# Patient Record
Sex: Male | Born: 1990 | Race: Black or African American | Hispanic: No | Marital: Single | Smoking: Current every day smoker
Health system: Southern US, Community
[De-identification: ages and names within clinical notes are randomized; demographics above are authoritative.]

## PROBLEM LIST (undated history)

## (undated) DIAGNOSIS — N4400 Torsion of testis, unspecified: Secondary | ICD-10-CM

---

## 2015-07-31 ENCOUNTER — Emergency Department: Payer: No Typology Code available for payment source

## 2015-07-31 ENCOUNTER — Emergency Department
Admission: EM | Admit: 2015-07-31 | Discharge: 2015-07-31 | Disposition: A | Discharge status: Home / Self Care | Payer: No Typology Code available for payment source | Attending: Emergency Medicine | Admitting: Emergency Medicine

## 2015-07-31 ENCOUNTER — Encounter: Payer: Self-pay | Admitting: Emergency Medicine

## 2015-07-31 DIAGNOSIS — Y998 Other external cause status: Secondary | ICD-10-CM | POA: Diagnosis not present

## 2015-07-31 DIAGNOSIS — F17210 Nicotine dependence, cigarettes, uncomplicated: Secondary | ICD-10-CM | POA: Insufficient documentation

## 2015-07-31 DIAGNOSIS — S20221A Contusion of right back wall of thorax, initial encounter: Secondary | ICD-10-CM | POA: Diagnosis not present

## 2015-07-31 DIAGNOSIS — Y9389 Activity, other specified: Secondary | ICD-10-CM | POA: Insufficient documentation

## 2015-07-31 DIAGNOSIS — S8012XA Contusion of left lower leg, initial encounter: Secondary | ICD-10-CM | POA: Insufficient documentation

## 2015-07-31 DIAGNOSIS — S9031XA Contusion of right foot, initial encounter: Secondary | ICD-10-CM | POA: Insufficient documentation

## 2015-07-31 DIAGNOSIS — Y92410 Unspecified street and highway as the place of occurrence of the external cause: Secondary | ICD-10-CM | POA: Diagnosis not present

## 2015-07-31 DIAGNOSIS — S8991XA Unspecified injury of right lower leg, initial encounter: Secondary | ICD-10-CM | POA: Diagnosis present

## 2015-07-31 DIAGNOSIS — S3991XA Unspecified injury of abdomen, initial encounter: Secondary | ICD-10-CM | POA: Diagnosis not present

## 2015-07-31 MED ORDER — SODIUM CHLORIDE 0.9 % IV BOLUS (SEPSIS)
1000.0000 mL | Freq: Once | INTRAVENOUS | Status: AC
  Administered 2015-07-31: 1000 mL via INTRAVENOUS

## 2015-07-31 MED ORDER — IOHEXOL 300 MG/ML  SOLN
100.0000 mL | Freq: Once | INTRAMUSCULAR | Status: AC | PRN
  Administered 2015-07-31: 100 mL via INTRAVENOUS

## 2015-07-31 MED ORDER — IBUPROFEN 800 MG PO TABS: 800.0000 mg | ORAL_TABLET | Freq: Three times a day (TID) | ORAL | Status: AC | PRN

## 2015-07-31 MED ORDER — ONDANSETRON HCL 4 MG/2ML IJ SOLN
4.0000 mg | Freq: Once | INTRAMUSCULAR | Status: AC
  Administered 2015-07-31: 4 mg via INTRAVENOUS
  Filled 2015-07-31: qty 2

## 2015-07-31 MED ORDER — MORPHINE SULFATE (PF) 4 MG/ML IV SOLN
4.0000 mg | Freq: Once | INTRAVENOUS | Status: AC
  Administered 2015-07-31: 4 mg via INTRAVENOUS
  Filled 2015-07-31: qty 1

## 2015-07-31 NOTE — ED Notes (Signed)
MD at bedside assessing patient  
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
## 2015-07-31 NOTE — Discharge Instructions (Signed)
Examination emergency department is reassuring for no broken bones or internal injuries. You are being treated for bruising/contusion as well as muscular stretching/strain.  Use ice to any particular sore areas for 24 hours, for 15 minutes at a time. After 24 hours, you may use heat for easing soreness.  Return to the emergency department for any worsening condition including worsening pain, any weakness or numbness, or any other symptoms concerning to you.   Contusion A contusion is a deep bruise. Contusions happen when an injury causes bleeding under the skin. Symptoms of bruising include pain, swelling, and discolored skin. The skin may turn blue, purple, or yellow. HOME CARE   Rest the injured area.  If told, put ice on the injured area.  Put ice in a plastic bag.  Place a towel between your skin and the bag.  Leave the ice on for 20 minutes, 2-3 times per day.  If told, put light pressure (compression) on the injured area using an elastic bandage. Make sure the bandage is not too tight. Remove it and put it back on as told by your doctor.  If possible, raise (elevate) the injured area above the level of your heart while you are sitting or lying down.  Take over-the-counter and prescription medicines only as told by your doctor. GET HELP IF:  Your symptoms do not get better after several days of treatment.  Your symptoms get worse.  You have trouble moving the injured area. GET HELP RIGHT AWAY IF:   You have very bad pain.  You have a loss of feeling (numbness) in a hand or foot.  Your hand or foot turns pale or cold.   This information is not intended to replace advice given to you by your health care provider. Make sure you discuss any questions you have with your health care provider.   Document Released: 01/03/2008 Document Revised: 04/07/2015 Document Reviewed: 12/02/2014 Elsevier Interactive Patient Education 2016 [employer] Muscle Strain A muscle strain is  an injury that occurs when a muscle is stretched beyond its normal length. Usually a small number of muscle fibers are torn when this happens. Muscle strain is rated in degrees. First-degree strains have the least amount of muscle fiber tearing and pain. Second-degree and third-degree strains have increasingly more tearing and pain.  Usually, recovery from muscle strain takes 1-2 weeks. Complete healing takes 5-6 weeks.  CAUSES  Muscle strain happens when a sudden, violent force placed on a muscle stretches it too far. This may occur with lifting, sports, or a fall.  RISK FACTORS Muscle strain is especially common in athletes.  SIGNS AND SYMPTOMS At the site of the muscle strain, there may be:  Pain.  Bruising.  Swelling.  Difficulty using the muscle due to pain or lack of normal function. DIAGNOSIS  Your health care provider will perform a physical exam and ask about your medical history. TREATMENT  Often, the best treatment for a muscle strain is resting, icing, and applying cold compresses to the injured area.  HOME CARE INSTRUCTIONS   Use the PRICE method of treatment to promote muscle healing during the first 2-3 days after your injury. The PRICE method involves:  Protecting the muscle from being injured again.  Restricting your activity and resting the injured body part.  Icing your injury. To do this, put ice in a plastic bag. Place a towel between your skin and the bag. Then, apply the ice and leave it on from 15-20 minutes each hour. After  the third day, switch to moist heat packs.  Apply compression to the injured area with a splint or elastic bandage. Be careful not to wrap it too tightly. This may interfere with blood circulation or increase swelling.  Elevate the injured body part above the level of your heart as often as you can.  Only take over-the-counter or prescription medicines for pain, discomfort, or fever as directed by your health care provider.  Warming up  prior to exercise helps to prevent future muscle strains. SEEK MEDICAL CARE IF:   You have increasing pain or swelling in the injured area.  You have numbness, tingling, or a significant loss of strength in the injured area. MAKE SURE YOU:   Understand these instructions.  Will watch your condition.  Will get help right away if you are not doing well or get worse.   This information is not intended to replace advice given to you by your health care provider. Make sure you discuss any questions you have with your health care provider.   Document Released: 07/17/2005 Document Revised: 05/07/2013 Document Reviewed: 02/13/2013 Elsevier Interactive Patient Education [employer]

## 2015-07-31 NOTE — ED Notes (Signed)
Patient transported to CT 
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
## 2015-07-31 NOTE — ED Notes (Signed)
X RAY at bedside 

## 2015-07-31 NOTE — ED Notes (Signed)
Patient instructed on use of crutches. Demonstrated proper use.

## 2015-07-31 NOTE — ED Notes (Signed)
Abdomen tender upon MDs palpation.
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
## 2015-07-31 NOTE — ED Notes (Signed)
MD at bedside. 
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
## 2016-02-13 ENCOUNTER — Encounter: Payer: Self-pay | Admitting: Emergency Medicine

## 2016-02-13 DIAGNOSIS — N390 Urinary tract infection, site not specified: Secondary | ICD-10-CM | POA: Insufficient documentation

## 2016-02-13 DIAGNOSIS — F17210 Nicotine dependence, cigarettes, uncomplicated: Secondary | ICD-10-CM | POA: Insufficient documentation

## 2016-02-13 DIAGNOSIS — N451 Epididymitis: Secondary | ICD-10-CM | POA: Insufficient documentation

## 2016-02-13 DIAGNOSIS — F1290 Cannabis use, unspecified, uncomplicated: Secondary | ICD-10-CM | POA: Insufficient documentation

## 2016-02-13 MED ORDER — OXYCODONE-ACETAMINOPHEN 5-325 MG PO TABS
1.0000 | ORAL_TABLET | Freq: Once | ORAL | Status: AC
  Administered 2016-02-13: 1 via ORAL

## 2016-02-13 NOTE — ED Notes (Signed)
Pt states right sided testicle swelling with pain. 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 Pt states has had small amount of blood from penis. Pt states is unable to walk due to pain or close legs.

## 2016-02-14 ENCOUNTER — Emergency Department: Payer: No Typology Code available for payment source

## 2016-02-14 ENCOUNTER — Emergency Department
Admission: EM | Admit: 2016-02-14 | Discharge: 2016-02-14 | Disposition: A | Discharge status: Home / Self Care | Payer: Self-pay | Attending: Emergency Medicine | Admitting: Emergency Medicine

## 2016-02-14 DIAGNOSIS — N390 Urinary tract infection, site not specified: Secondary | ICD-10-CM

## 2016-02-14 DIAGNOSIS — N50811 Right testicular pain: Secondary | ICD-10-CM

## 2016-02-14 DIAGNOSIS — N451 Epididymitis: Secondary | ICD-10-CM

## 2016-02-14 HISTORY — DX: Torsion of testis, unspecified: N44.00

## 2016-02-14 LAB — URINALYSIS COMPLETE WITH MICROSCOPIC (ARMC ONLY)
Glucose, UA: NEGATIVE mg/dL
HGB URINE DIPSTICK: NEGATIVE
NITRITE: NEGATIVE
PH: 5 (ref 5.0–8.0)
PROTEIN: 100 mg/dL — AB
SPECIFIC GRAVITY, URINE: 1.033 — AB (ref 1.005–1.030)

## 2016-02-14 MED ORDER — CEFTRIAXONE SODIUM 250 MG IJ SOLR
250.0000 mg | Freq: Once | INTRAMUSCULAR | Status: AC
  Administered 2016-02-14: 250 mg via INTRAMUSCULAR
  Filled 2016-02-14: qty 250

## 2016-02-14 MED ORDER — LIDOCAINE HCL (PF) 1 % IJ SOLN
0.9000 mL | Freq: Once | INTRAMUSCULAR | Status: AC
  Administered 2016-02-14: 0.9 mL via INTRADERMAL

## 2016-02-14 MED ORDER — LIDOCAINE HCL (PF) 1 % IJ SOLN
INTRAMUSCULAR | Status: AC
  Administered 2016-02-14: 0.9 mL via INTRADERMAL
  Filled 2016-02-14: qty 5

## 2016-02-14 MED ORDER — OXYCODONE-ACETAMINOPHEN 5-325 MG PO TABS
ORAL_TABLET | ORAL | Status: AC
  Filled 2016-02-14: qty 1

## 2016-02-14 MED ORDER — LEVOFLOXACIN 500 MG PO TABS
500.0000 mg | ORAL_TABLET | Freq: Once | ORAL | Status: AC
  Administered 2016-02-14: 500 mg via ORAL
  Filled 2016-02-14: qty 1

## 2016-02-14 MED ORDER — LEVOFLOXACIN 500 MG PO TABS: 500.0000 mg | ORAL_TABLET | Freq: Every day | ORAL | Status: AC

## 2016-02-14 MED ORDER — OXYCODONE-ACETAMINOPHEN 5-325 MG PO TABS
1.0000 | ORAL_TABLET | Freq: Once | ORAL | Status: AC
Start: 2016-02-14 — End: 2016-02-14
  Administered 2016-02-14: 1 via ORAL
  Filled 2016-02-14: qty 1

## 2016-02-14 MED ORDER — OXYCODONE-ACETAMINOPHEN 5-325 MG PO TABS: 1.0000 | ORAL_TABLET | ORAL | Status: AC | PRN

## 2016-02-14 NOTE — ED Notes (Signed)
Spoke with patient, explained reason for wait.  Patient verbalized understanding.

## 2016-02-14 NOTE — ED Provider Notes (Signed)
[facility] Emergency Department Provider Note  ____________________________________________  Time seen: 4:00 AM  I have reviewed the triage vital signs and the nursing notes.   HISTORY  Chief Complaint Groin Swelling      HPI Jonathan Walters is a 25 y.o. male with history of testicular torsion and the age of 40 presents with right sided testicular pain 2 days accompanied with a scant amount of blood from his penis. Patient denies any dysuria. Patient does admit to unprotected sex    Past Medical History  Diagnosis Date  . Testicular torsion     There are no active problems to display for this patient.   No past surgical history on file.  Current Outpatient Rx  Name  Route  Sig  Dispense  Refill  . ibuprofen (ADVIL,MOTRIN) 800 MG tablet   Oral   Take 1 tablet (800 mg total) by mouth every 8 (eight) hours as needed.   30 tablet   0     Allergies No known drug allergies No family history on file.  Social History Social History  Substance Use Topics  . Smoking status: Current Every Day Smoker -- 1.00 packs/day    Types: Cigarettes  . Smokeless tobacco: Never Used  . Alcohol Use: No    Review of Systems  Constitutional: Negative for fever. Eyes: Negative for visual changes. ENT: Negative for sore throat. Cardiovascular: Negative for chest pain. Respiratory: Negative for shortness of breath. Gastrointestinal: Negative for abdominal pain, vomiting and diarrhea. Genitourinary: Negative for dysuria.Positive for right testicular pain Musculoskeletal: Negative for back pain. Skin: Negative for rash. Neurological: Negative for headaches, focal weakness or numbness.  10-point ROS otherwise negative.  ____________________________________________   PHYSICAL EXAM:  VITAL SIGNS: ED Triage Vitals  Enc Vitals Group     BP 02/13/16 2352 139/65 mmHg     Pulse Rate 02/13/16 2352 85     Resp 02/13/16 2352 16     Temp 02/13/16 2352 99 F  (37.2 C)     Temp Source 02/13/16 2352 Oral     SpO2 02/13/16 2352 100 %     Weight 02/13/16 2352 200 lb (90.719 kg)     Height 02/13/16 2352 5\' 9"  (1.753 m)     Head Cir --      Peak Flow --      Pain Score 02/13/16 2352 10     Pain Loc --      Pain Edu? --      Excl. in GC? --      Constitutional: Alert and oriented. Well appearing and in no distress. Eyes: Conjunctivae are normal. PERRL. Normal extraocular movements. ENT   Head: Normocephalic and atraumatic.   Nose: No congestion/rhinnorhea.   Mouth/Throat: Mucous membranes are moist.   Neck: No stridor. Hematological/Lymphatic/Immunilogical: No cervical lymphadenopathy. Cardiovascular: Normal rate, regular rhythm. Normal and symmetric distal pulses are present in all extremities. No murmurs, rubs, or gallops. Respiratory: Normal respiratory effort without tachypnea nor retractions. Breath sounds are clear and equal bilaterally. No wheezes/rales/rhonchi. Gastrointestinal: Soft and nontender. No distention. There is no CVA tenderness. Genitourinary: Bilateral testicular swelling right greater than left tenderness bilaterally Musculoskeletal: Nontender with normal range of motion in all extremities. No joint effusions.  No lower extremity tenderness nor edema. Neurologic:  Normal speech and language. No gross focal neurologic deficits are appreciated. Speech is normal.  Skin:  Skin is warm, dry and intact. No rash noted. Psychiatric: Mood and affect are normal. Speech and behavior are normal.  Patient exhibits appropriate insight and judgment.  ____________________________________________    LABS (pertinent positives/negatives)  Labs Reviewed  URINALYSIS COMPLETEWITH MICROSCOPIC ([facility] ONLY) - Abnormal; Notable for the following:    Color, Urine AMBER (*)    APPearance CLOUDY (*)    Bilirubin Urine 1+ (*)    Ketones, ur 2+ (*)    Specific Gravity, Urine 1.033 (*)    Protein, ur 100 (*)    Leukocytes, UA 3+ (*)     Bacteria, UA RARE (*)    Squamous Epithelial / LPF 0-5 (*)    All other components within normal limits       RADIOLOGY  US Scrotum (Final result) Result time: 02/14/16 02:30:34   Final result by Rad Results In Interface (02/14/16 02:30:34)   Narrative:   CLINICAL DATA: Right testicular pain for 3 days  EXAM: SCROTAL ULTRASOUND  DOPPLER ULTRASOUND OF THE TESTICLES  TECHNIQUE: Complete ultrasound examination of the testicles, epididymis, and other scrotal structures was performed. Color and spectral Doppler ultrasound were also utilized to evaluate blood flow to the testicles.  COMPARISON: None.  FINDINGS: Right testicle  Measurements: 3.9 x 2.8 x 3.1 cm. There is a rounded region of decreased echogenicity in the testicle measuring 1.7 x 1.9 cm. This region is hypervascular as is the remainder of the right testicle. Arterial and venous waveforms are identified in the right testicle.  Left testicle  Measurements: 4.5 x 2.4 x 3.0 cm. No mass or microlithiasis visualized.  Right epididymis: Enlarged and hypervascular.  Left epididymis: Mildly prominent with mild increased blood flow.  Hydrocele: Right greater than left hydroceles.  Varicocele: Left varicocele.  Pulsed Doppler interrogation of both testes demonstrates increased blood flow in the right testicle and right epididymis. Mildly prominent blood flow in the left epididymis. Normal blood flow in the left testicle.  IMPRESSION: 1. Right epididymitis and orchitis. The rounded masslike region of decreased echogenicity in the testicle could represent focal infection, at risk of developing into an abscess. However, a neoplasm is not excluded on this study. Recommend a short-term follow-up after treatment for orchitis to exclude underlying neoplasm. 2. Possible mild left epididymitis. 3. Bilateral hydroceles. Left varicocele.   Electronically Signed By: Gerome Sam III M.D On:  02/14/2016 02:30          [place] Art/Ven Flow Abd Pelv Doppler (Final result) Result time: 02/14/16 02:30:34   Final result by Rad Results In Interface (02/14/16 02:30:34)   Narrative:   CLINICAL DATA: Right testicular pain for 3 days  EXAM: SCROTAL ULTRASOUND  DOPPLER ULTRASOUND OF THE TESTICLES  TECHNIQUE: Complete ultrasound examination of the testicles, epididymis, and other scrotal structures was performed. Color and spectral Doppler ultrasound were also utilized to evaluate blood flow to the testicles.  COMPARISON: None.  FINDINGS: Right testicle  Measurements: 3.9 x 2.8 x 3.1 cm. There is a rounded region of decreased echogenicity in the testicle measuring 1.7 x 1.9 cm. This region is hypervascular as is the remainder of the right testicle. Arterial and venous waveforms are identified in the right testicle.  Left testicle  Measurements: 4.5 x 2.4 x 3.0 cm. No mass or microlithiasis visualized.  Right epididymis: Enlarged and hypervascular.  Left epididymis: Mildly prominent with mild increased blood flow.  Hydrocele: Right greater than left hydroceles.  Varicocele: Left varicocele.  Pulsed Doppler interrogation of both testes demonstrates increased blood flow in the right testicle and right epididymis. Mildly prominent blood flow in the left epididymis. Normal blood flow in the left testicle.  IMPRESSION: 1. Right epididymitis and orchitis. The rounded masslike region of decreased echogenicity in the testicle could represent focal infection, at risk of developing into an abscess. However, a neoplasm is not excluded on this study. Recommend a short-term follow-up after treatment for orchitis to exclude underlying neoplasm. 2. Possible mild left epididymitis. 3. Bilateral hydroceles. Left varicocele.   Electronically Signed By: Gerome Samavid Williams III M.D On: 02/14/2016 02:30         Procedures     INITIAL IMPRESSION / ASSESSMENT  AND PLAN / ED COURSE  Pertinent labs & imaging results that were available during my care of the patient were reviewed by me and considered in my medical decision making (see chart for details).  Ceftriaxone 250MG  given, Levaquin 500mg   ____________________________________________   FINAL CLINICAL IMPRESSION(S) / ED DIAGNOSES  Final diagnoses:  Epididymitis, bilateral  UTI (lower urinary tract infection)      Darci Currentandolph N Brown, MD 02/14/16 [phone]

## 2016-02-14 NOTE — ED Notes (Signed)
Pt. Going home with friend 

## 2016-02-14 NOTE — Discharge Instructions (Signed)
Epididymitis °Epididymitis is swelling (inflammation) of the epididymis. The epididymis is a cord-like structure that is located along the top and back part of the testicle. It collects and stores sperm from the testicle. °This condition can also cause pain and swelling of the testicle and scrotum. Symptoms usually start suddenly (acute epididymitis). Sometimes epididymitis starts gradually and lasts for a while (chronic epididymitis). This type may be harder to treat. °CAUSES °In men 35 and younger, this condition is usually caused by a bacterial infection or sexually transmitted disease (STD), such as: °· Gonorrhea. °· Chlamydia.   °In men 35 and older who do not have anal sex, this condition is usually caused by bacteria from a blockage or abnormalities in the urinary system. These can result from: °· Having a tube placed into the bladder (urinary catheter). °· Having an enlarged or inflamed prostate gland. °· Having recent urinary tract surgery. °In men who have a condition that weakens the body's defense system (immune system), such as HIV, this condition can be caused by:  °· Other bacteria, including tuberculosis and syphilis. °· Viruses. °· Fungi. °Sometimes this condition occurs without infection. That may happen if urine flows backward into the epididymis after heavy lifting or straining. °RISK FACTORS °This condition is more likely to develop in men: °· Who have unprotected sex with more than one partner. °· Who have anal sex.   °· Who have recently had surgery.   °· Who have a urinary catheter. °· Who have urinary problems. °· Who have a suppressed immune system.   °SYMPTOMS  °This condition usually begins suddenly with chills, fever, and pain behind the scrotum and in the testicle. Other symptoms include:  °· Swelling of the scrotum, testicle, or both. °· Pain when ejaculating or urinating. °· Pain in the back or belly. °· Nausea. °· Itching and discharge from the penis. °· Frequent need to pass  urine. °· Redness and tenderness of the scrotum. °DIAGNOSIS °Your health care provider can diagnose this condition based on your symptoms and medical history. Your health care provider will also do a physical exam to ask about your symptoms and check your scrotum and testicle for swelling, pain, and redness. You may also have other tests, including:   °· Examination of discharge from the penis. °· Urine tests for infections, such as STDs.   °Your health care provider may test you for other STDs, including HIV.  °TREATMENT °Treatment for this condition depends on the cause. If your condition is caused by a bacterial infection, oral antibiotic medicine may be prescribed. If the bacterial infection has spread to your blood, you may need to receive IV antibiotics. Nonbacterial epididymitis is treated with home care that includes bed rest and elevation of the scrotum. °Surgery may be needed to treat: °· Bacterial epididymitis that causes pus to build up in the scrotum (abscess). °· Chronic epididymitis that has not responded to other treatments. °HOME CARE INSTRUCTIONS °Medicines  °· Take over-the-counter and prescription medicines only as told by your health care provider.   °· If you were prescribed an antibiotic medicine, take it as told by your health care provider. Do not stop taking the antibiotic even if your condition improves. °Sexual Activity  °· If your epididymitis was caused by an STD, avoid sexual activity until your treatment is complete. °· Inform your sexual partner or partners if you test positive for an STD. They may need to be treated. Do not engage in sexual activity with your partner or partners until their treatment is completed. °General Instructions  °· Return to your normal activities as told   by your health care provider. Ask your health care provider what activities are safe for you. °· Keep your scrotum elevated and supported while resting. Ask your health care provider if you should wear a  scrotal support, such as a jockstrap. Wear it as told by your health care provider. °· If directed, apply ice to the affected area:   °¨ Put ice in a plastic bag. °¨ Place a towel between your skin and the bag. °¨ Leave the ice on for 20 minutes, 2-3 times per day. °· Try taking a sitz bath to help with discomfort. This is a warm water bath that is taken while you are sitting down. The water should only come up to your hips and should cover your buttocks. Do this 3-4 times per day or as told by your health care provider. °· Keep all follow-up visits as told by your health care provider. This is important. °SEEK MEDICAL CARE IF:  °· You have a fever.   °· Your pain medicine is not helping.   °· Your pain is getting worse.   °· Your symptoms do not improve within three days. °  °This information is not intended to replace advice given to you by your health care provider. Make sure you discuss any questions you have with your health care provider. °  °Document Released: 07/14/2000 Document Revised: 04/07/2015 Document Reviewed: 12/02/2014 °Elsevier Interactive Patient Education ©2016 [employer] ° °

## 2016-02-14 NOTE — ED Notes (Signed)
Pt. States testical pain for the past couple days.  Pt. Sore with palpation above groan area.

## 2016-02-18 ENCOUNTER — Encounter: Payer: Self-pay | Admitting: Emergency Medicine

## 2016-02-18 ENCOUNTER — Emergency Department: Payer: No Typology Code available for payment source

## 2016-02-18 ENCOUNTER — Emergency Department
Admission: EM | Admit: 2016-02-18 | Discharge: 2016-02-18 | Disposition: A | Discharge status: Home / Self Care | Payer: No Typology Code available for payment source | Attending: Emergency Medicine | Admitting: Emergency Medicine

## 2016-02-18 DIAGNOSIS — N453 Epididymo-orchitis: Secondary | ICD-10-CM | POA: Insufficient documentation

## 2016-02-18 DIAGNOSIS — R609 Edema, unspecified: Secondary | ICD-10-CM

## 2016-02-18 DIAGNOSIS — F17210 Nicotine dependence, cigarettes, uncomplicated: Secondary | ICD-10-CM | POA: Insufficient documentation

## 2016-02-18 DIAGNOSIS — F1290 Cannabis use, unspecified, uncomplicated: Secondary | ICD-10-CM | POA: Insufficient documentation

## 2016-02-18 MED ORDER — KETOROLAC TROMETHAMINE 60 MG/2ML IM SOLN
60.0000 mg | Freq: Once | INTRAMUSCULAR | Status: AC
  Administered 2016-02-18: 60 mg via INTRAMUSCULAR
  Filled 2016-02-18: qty 2

## 2016-02-18 MED ORDER — DOXYCYCLINE HYCLATE 100 MG PO TABS
100.0000 mg | ORAL_TABLET | Freq: Once | ORAL | Status: AC
  Administered 2016-02-18: 100 mg via ORAL
  Filled 2016-02-18: qty 1

## 2016-02-18 MED ORDER — DOXYCYCLINE HYCLATE 100 MG PO TABS: 100.0000 mg | ORAL_TABLET | Freq: Two times a day (BID) | ORAL | Status: AC

## 2016-02-18 NOTE — Discharge Instructions (Signed)
It is possible that your condition is being caused by a sexually transmitted disease. Therefore it is recommended that you do not have any sexual activity until you are further tested and cleared for other STDs such as herpes, syphilis and HIV.  Do not return to any sexual activity until you're cleared by health care provider after this testing.  Orchitis Orchitis is swelling (inflammation) of a testicle caused by infection. Testicles are the male organs that produce sperm. The testicles are held in a fleshy sac (scrotum) located behind the penis. Orchitis usually affects only one testicle, but it can occur in both. The condition can develop suddenly. Orchitis can be caused by many different kinds of bacteria and viruses. CAUSES Orchitis can be caused by either a bacterial or viral infection. Bacterial Infections  These often occur along with an infection of the coiled tube that collects sperm and sits on top of the testicle (epididymis).  In men who are not sexually active, bacterial orchitis usually starts as a urinary tract infection and spreads to the testicle.  In sexually active men, sexually transmitted infections are the most common cause of bacterial orchitis. These can include:  Gonorrhea.  Chlamydia. Viral Infections  Mumps is still the most common cause of viral orchitis, though mumps is now rare in many areas because of vaccination.  Other viruses that can cause orchitis include:  The chickenpox virus (varicella-zoster virus).  The virus that causes mononucleosis (Epstein-Barr virus). RISK FACTORS Boys and men who have not been vaccinated against mumps are at risk for mumps orchitis. Risk factors for bacterial orchitis include:  Frequent urinary tract infections.  High-risk sexual behaviors.  Having a sexual partner with a sexually transmitted infection.  Having had urinary tract surgery.  Using a tube passed through the penis to drain urine (Foley catheter).  An  enlarged prostate gland. SIGNS AND SYMPTOMS The most common symptoms of orchitis are swelling and pain in the scrotum. Other signs and symptoms may include:  Feeling generally sick (malaise).  Fever and chills.  Painful urination.  Painful ejaculation.  Blood or discharge from the penis.  Nausea.  Headache.  Fatigue. DIAGNOSIS Your health care provider may suspect orchitis if you have a painful, swollen testicle along with other signs and symptoms of the condition. A physical exam will be done. Tests may also be done to help your health care provider make a diagnosis. These may include:  A blood test to check for signs of infection.  A urine test to check for a urinary tract infection.  Using a swab to collect a fluid sample from the tip of the penis to test for sexually transmitted infections.  Taking an image of the testicle using sound waves and a computer (testicular ultrasound). TREATMENT Treatment of orchitis depends on the cause. For orchitis caused by a bacterial infection, your health care provider will most likely prescribe antibiotic medicines. Bacterial infections usually clear up within a few days. Both viral infections and bacterial infections may be treated with:  Bed rest.  Anti-inflammatory medicines.  Pain medicines.  Elevating the scrotum and applying ice. HOME CARE INSTRUCTIONS  Rest as directed by your health care provider.  Take medicines only as directed by your health care provider.  If you were prescribed an antibiotic medicine, finish it all even if you start to feel better.  Elevate your scrotum and apply ice as directed:  Put ice in a plastic bag.  Place a small towel or pillow between your legs.  Rest your scrotum on the pillow or towel.  Place another towel between your skin and the plastic bag.  Leave the ice on for 20 minutes, 2-3 times a day. SEEK MEDICAL CARE IF:  You have a fever.  Pain and swelling have not gotten  better after 3 days. SEEK IMMEDIATE MEDICAL CARE IF:  Your pain is getting worse.  The swelling in your testicle gets worse.   This information is not intended to replace advice given to you by your health care provider. Make sure you discuss any questions you have with your health care provider.   Document Released: 07/14/2000 Document Revised: 08/07/2014 Document Reviewed: 12/04/2013 Elsevier Interactive Patient Education [employer]2016 Else[employer] Inc.

## 2016-02-18 NOTE — ED Notes (Addendum)
Pt to triage via w/c with no distress noted, brought in by EMS; pt reports right testicular pain/swelling tonight with no accomp symptoms; seen for same for 7/17; st pain unrelieved by percocet; cont to take levaquin as rx for epidymytis

## 2016-02-18 NOTE — ED Notes (Signed)
Pt. States he will follow up [facility] for [facility]. Within the week.

## 2016-02-18 NOTE — ED Provider Notes (Signed)
[facility] Emergency Department Provider Note   ____________________________________________  Time seen: Approximately 5 AM  I have reviewed the triage vital signs and the nursing notes.   HISTORY  Chief Complaint No chief complaint on file.   HPI Jonathan Walters is a 25 y.o. 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 He was diagnosed with right epididymitis and orchitis. He was discharged with Levaquin and was also given a shot of Rocephin before leaving the emergency department to cover for gonorrhea. The patient says that the pain had been decreasing but with worsening pain at about 10 PM last night. He says that he was just sitting on a couch when the pain worsened. He says that the pain is sharp into the right testicle radiating up into the right groin. He denies any nausea or vomiting. Denies any discharge or burning with urination. Has been using the Percocet and says it did not relieve his pain.   Past Medical History  Diagnosis Date  . Testicular torsion     There are no active problems to display for this patient.   History reviewed. No pertinent past surgical history.  Current Outpatient Rx  Name  Route  Sig  Dispense  Refill  . ibuprofen (ADVIL,MOTRIN) 800 MG tablet   Oral   Take 1 tablet (800 mg total) by mouth every 8 (eight) hours as needed.   30 tablet   0   . levofloxacin (LEVAQUIN) 500 MG tablet   Oral   Take 1 tablet (500 mg total) by mouth daily.   10 tablet   0   . oxyCODONE-acetaminophen (PERCOCET/ROXICET) 5-325 MG tablet   Oral   Take 1 tablet by mouth every 4 (four) hours as needed for severe pain.   20 tablet   0     Allergies Review of patient's allergies indicates no known allergies.  No family history on  file.  Social History Social History  Substance Use Topics  . Smoking status: Current Every Day Smoker -- 1.00 packs/day    Types: Cigarettes  . Smokeless tobacco: Never Used  . Alcohol Use: No    Review of Systems onstitutional: No fever/chills Eyes: No visual changes. ENT: No sore throat. Cardiovascular: Denies chest pain. Respiratory: Denies shortness of breath. Gastrointestinal: No abdominal pain.  No nausea, no vomiting.  No diarrhea.  No constipation. Genitourinary: Negative for dysuria. Musculoskeletal: Negative for back pain. Skin: Negative for rash. Neurological: Negative for headaches, focal weakness or numbness.   10-point ROS otherwise negative.  ____________________________________________   PHYSICAL EXAM:  VITAL SIGNS: ED Triage Vitals  Enc Vitals Group     BP 02/18/16 0104 121/53 mmHg     Pulse Rate 02/18/16 0104 75     Resp 02/18/16 0104 18     Temp 02/18/16 0104 98.3 F (36.8 C)     Temp Source 02/18/16 0104 Oral     SpO2 02/18/16 0104 93 %     Weight 02/18/16 0104 205 lb (92.987 kg)     Height 02/18/16 0104 5\' 9"  (1.753 m)     Head Cir --      Peak Flow --      Pain Score 02/18/16 0104 10     Pain Loc --      Pain Edu? --  Excl. in GC? --     Constitutional: Alert and oriented. Well appearing and in no acute distress. Eyes: Conjunctivae are normal. PERRL. EOMI. Head: Atraumatic. Nose: No congestion/rhinnorhea. Mouth/Throat: Mucous membranes are moist.   Neck: No stridor.   Cardiovascular: Normal rate, regular rhythm. Grossly normal heart sounds.   Respiratory: Normal respiratory effort.  No retractions. Lungs CTAB. Gastrointestinal: Soft and nontender. No distention.  No CVA tenderness. Musculoskeletal: No lower extremity tenderness nor edema.  No joint effusions. Genitourinary:  Normal appearance of the external genitalia. Circumcised male. Mild tenderness along the right inguinal lymph node chain. Also with tenderness to the right  testicle as well as epididymitis numbness. No masses. No erythema, induration or fluctuance. Neurologic:  Normal speech and language. No gross focal neurologic deficits are appreciated. No gait instability. Skin:  Skin is warm, dry and intact. No rash noted. Psychiatric: Mood and affect are normal. Speech and behavior are normal.  ____________________________________________   LABS (all labs ordered are listed, but only abnormal results are displayed)  Labs Reviewed  URINALYSIS COMPLETEWITH MICROSCOPIC ([facility] ONLY)   ____________________________________________  EKG   ____________________________________________  RADIOLOGY  US Scrotum (Final result) Result time: 02/18/16 02:19:10   Final result by Rad Results In Interface (02/18/16 02:19:10)   Narrative:   CLINICAL DATA: Testicular swelling for 1 week.  EXAM: SCROTAL ULTRASOUND  DOPPLER ULTRASOUND OF THE TESTICLES  TECHNIQUE: Complete ultrasound examination of the testicles, epididymis, and other scrotal structures was performed. Color and spectral Doppler ultrasound were also utilized to evaluate blood flow to the testicles.  COMPARISON: 02/14/2016  FINDINGS: Right testicle  Measurements: 41 x 26 x 32 mm. Hypervascular but improved. No infarct or mass lesion. The hypoechoic area seen previously is resolved.  Left testicle  Measurements: 44 x 26 x 30 mm. No mass or microlithiasis visualized.  Right epididymis: Enlarged and hypervascular.  Left epididymis: Mild thickened appearance of the head on the left is likely accentuated by angle of imaging. No convincing epididymitis based on vascularity.  Hydrocele: Small simple appearing on the right. No pyocele.  Varicocele: Present on the left.  Pulsed Doppler interrogation of both testes demonstrates normal low resistance arterial and venous waveforms bilaterally.  IMPRESSION: Re-demonstrated right epididymo-orchitis with improvement in  the hyperemia. The focal right testicular abnormality seen previously is resolved and attributed to infection. No abscess or pyocele.   Electronically Signed By: Marnee SpringJonathon Watts M.D. On: 02/18/2016 02:19          US Art/Ven Flow Abd Pelv Doppler (Final result) Result time: 02/18/16 02:19:10   Final result by Rad Results In Interface (02/18/16 02:19:10)   Narrative:   CLINICAL DATA: Testicular swelling for 1 week.  EXAM: SCROTAL ULTRASOUND  DOPPLER ULTRASOUND OF THE TESTICLES  TECHNIQUE: Complete ultrasound examination of the testicles, epididymis, and other scrotal structures was performed. Color and spectral Doppler ultrasound were also utilized to evaluate blood flow to the testicles.  COMPARISON: 02/14/2016  FINDINGS: Right testicle  Measurements: 41 x 26 x 32 mm. Hypervascular but improved. No infarct or mass lesion. The hypoechoic area seen previously is resolved.  Left testicle  Measurements: 44 x 26 x 30 mm. No mass or microlithiasis visualized.  Right epididymis: Enlarged and hypervascular.  Left epididymis: Mild thickened appearance of the head on the left is likely accentuated by angle of imaging. No convincing epididymitis based on vascularity.  Hydrocele: Small simple appearing on the right. No pyocele.  Varicocele: Present on the left.  Pulsed Doppler interrogation of both testes demonstrates  normal low resistance arterial and venous waveforms bilaterally.  IMPRESSION: Re-demonstrated right epididymo-orchitis with improvement in the hyperemia. The focal right testicular abnormality seen previously is resolved and attributed to infection. No abscess or pyocele.   Electronically Signed By: Marnee Spring M.D. On: 02/18/2016 02:19    ____________________________________________   PROCEDURES  Procedures ____________________________________________   INITIAL IMPRESSION / ASSESSMENT AND PLAN / ED COURSE  Pertinent  labs & imaging results that were available during my care of the patient were reviewed by me and considered in my medical decision making (see chart for details).  ----------------------------------------- 6:01 AM on 02/18/2016 -----------------------------------------  Pain improved after Toradol. Ultrasound also appears improved. We'll add doxycycline for orchitis epididymitis. Patient given urine cup but has not been able to give a urine sample. Patient understands the plan for follow-up with the [facility]. Will continue taking the Levaquin. ____________________________________________   FINAL CLINICAL IMPRESSION(S) / ED DIAGNOSES  Final diagnoses:  Swelling  Epididymitis, orchitis.    NEW MEDICATIONS STARTED DURING THIS VISIT:  New Prescriptions   No medications on file     Note:  This document was prepared using Dragon voice recognition software and may include unintentional dictation errors.    Myrna Blazer, MD 02/18/16 [phone]

## 2018-03-08 ENCOUNTER — Encounter: Payer: Self-pay | Admitting: Emergency Medicine

## 2018-03-08 ENCOUNTER — Emergency Department
Admission: EM | Admit: 2018-03-08 | Discharge: 2018-03-08 | Disposition: A | Discharge status: Home / Self Care | Payer: No Typology Code available for payment source | Attending: Emergency Medicine | Admitting: Emergency Medicine

## 2018-03-08 ENCOUNTER — Other Ambulatory Visit: Payer: Self-pay

## 2018-03-08 ENCOUNTER — Emergency Department: Payer: No Typology Code available for payment source

## 2018-03-08 DIAGNOSIS — S61214A Laceration without foreign body of right ring finger without damage to nail, initial encounter: Secondary | ICD-10-CM | POA: Diagnosis not present

## 2018-03-08 DIAGNOSIS — G44319 Acute post-traumatic headache, not intractable: Secondary | ICD-10-CM

## 2018-03-08 DIAGNOSIS — Y92410 Unspecified street and highway as the place of occurrence of the external cause: Secondary | ICD-10-CM | POA: Diagnosis not present

## 2018-03-08 DIAGNOSIS — S161XXA Strain of muscle, fascia and tendon at neck level, initial encounter: Secondary | ICD-10-CM | POA: Insufficient documentation

## 2018-03-08 DIAGNOSIS — S61212A Laceration without foreign body of right middle finger without damage to nail, initial encounter: Secondary | ICD-10-CM | POA: Diagnosis not present

## 2018-03-08 DIAGNOSIS — Y939 Activity, unspecified: Secondary | ICD-10-CM | POA: Insufficient documentation

## 2018-03-08 DIAGNOSIS — S61411A Laceration without foreign body of right hand, initial encounter: Secondary | ICD-10-CM | POA: Diagnosis not present

## 2018-03-08 DIAGNOSIS — Y998 Other external cause status: Secondary | ICD-10-CM | POA: Insufficient documentation

## 2018-03-08 DIAGNOSIS — F17210 Nicotine dependence, cigarettes, uncomplicated: Secondary | ICD-10-CM | POA: Diagnosis not present

## 2018-03-08 DIAGNOSIS — S40011A Contusion of right shoulder, initial encounter: Secondary | ICD-10-CM | POA: Diagnosis not present

## 2018-03-08 DIAGNOSIS — S61216A Laceration without foreign body of right little finger without damage to nail, initial encounter: Secondary | ICD-10-CM | POA: Insufficient documentation

## 2018-03-08 MED ORDER — MELOXICAM 15 MG PO TABS: 15.0000 mg | ORAL_TABLET | Freq: Every day | ORAL | 0 refills | Status: AC

## 2018-03-08 MED ORDER — TRAMADOL HCL 50 MG PO TABS: 50.0000 mg | ORAL_TABLET | Freq: Four times a day (QID) | ORAL | 0 refills | Status: AC | PRN

## 2018-03-08 MED ORDER — AMOXICILLIN-POT CLAVULANATE 875-125 MG PO TABS: 1.0000 | ORAL_TABLET | Freq: Two times a day (BID) | ORAL | 0 refills | Status: AC
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
